# Patient Record
Sex: Male | Born: 1985
Health system: Southern US, Community
[De-identification: ages and names within clinical notes are randomized; demographics above are authoritative.]

---

## 2008-01-20 ENCOUNTER — Emergency Department (HOSPITAL_COMMUNITY): Admission: EM | Admit: 2008-01-20 | Discharge: 2008-01-20 | Payer: Self-pay | Admitting: Emergency Medicine

## 2010-11-13 ENCOUNTER — Encounter: Payer: Self-pay | Admitting: Gastroenterology

## 2011-07-17 LAB — POCT I-STAT, CHEM 8
BUN: 8
Calcium, Ion: 1.19
Chloride: 106
Creatinine, Ser: 1.2
Glucose, Bld: 97
HCT: 46
Hemoglobin: 15.6
Potassium: 4.9
Sodium: 141
TCO2: 27

## 2011-07-17 LAB — CBC
HCT: 42.8
Hemoglobin: 14.7
MCHC: 34.3
MCV: 81.3
Platelets: 216
RBC: 5.26
RDW: 13.4
WBC: 4.7

## 2011-07-17 LAB — URINALYSIS, ROUTINE W REFLEX MICROSCOPIC
Bilirubin Urine: NEGATIVE
Glucose, UA: NEGATIVE
Hgb urine dipstick: NEGATIVE
Ketones, ur: NEGATIVE
Nitrite: NEGATIVE
Protein, ur: NEGATIVE
Specific Gravity, Urine: 1.019
Urobilinogen, UA: 1
pH: 7.5

## 2011-07-17 LAB — DIFFERENTIAL
Basophils Absolute: 0
Lymphocytes Relative: 34
Neutro Abs: 2.7

## 2021-01-13 ENCOUNTER — Ambulatory Visit (INDEPENDENT_AMBULATORY_CARE_PROVIDER_SITE_OTHER): Payer: BC Managed Care – PPO | Admitting: Internal Medicine

## 2021-01-13 ENCOUNTER — Encounter: Payer: Self-pay | Admitting: Internal Medicine

## 2021-01-13 ENCOUNTER — Other Ambulatory Visit: Payer: Self-pay

## 2021-01-13 VITALS — BP 118/76 | HR 81 | Temp 98.1°F | Ht 70.0 in | Wt 221.0 lb

## 2021-01-13 DIAGNOSIS — Z6831 Body mass index (BMI) 31.0-31.9, adult: Secondary | ICD-10-CM

## 2021-01-13 DIAGNOSIS — E6609 Other obesity due to excess calories: Secondary | ICD-10-CM | POA: Diagnosis not present

## 2021-01-13 DIAGNOSIS — Z0000 Encounter for general adult medical examination without abnormal findings: Secondary | ICD-10-CM | POA: Diagnosis not present

## 2021-01-13 DIAGNOSIS — Z23 Encounter for immunization: Secondary | ICD-10-CM | POA: Diagnosis not present

## 2021-01-13 LAB — CBC WITH DIFFERENTIAL/PLATELET
Basophils Absolute: 0 10*3/uL (ref 0.0–0.1)
Basophils Relative: 0.6 % (ref 0.0–3.0)
Eosinophils Absolute: 0.3 10*3/uL (ref 0.0–0.7)
Eosinophils Relative: 4 % (ref 0.0–5.0)
HCT: 43.5 % (ref 39.0–52.0)
Hemoglobin: 14.4 g/dL (ref 13.0–17.0)
Lymphocytes Relative: 42.8 % (ref 12.0–46.0)
Lymphs Abs: 2.7 10*3/uL (ref 0.7–4.0)
MCHC: 33.1 g/dL (ref 30.0–36.0)
MCV: 82.4 fl (ref 78.0–100.0)
Monocytes Absolute: 0.4 10*3/uL (ref 0.1–1.0)
Monocytes Relative: 6.1 % (ref 3.0–12.0)
Neutro Abs: 3 10*3/uL (ref 1.4–7.7)
Neutrophils Relative %: 46.5 % (ref 43.0–77.0)
Platelets: 204 10*3/uL (ref 150.0–400.0)
RBC: 5.28 Mil/uL (ref 4.22–5.81)
RDW: 13.9 % (ref 11.5–15.5)
WBC: 6.4 10*3/uL (ref 4.0–10.5)

## 2021-01-13 LAB — LIPID PANEL
Cholesterol: 209 mg/dL — ABNORMAL HIGH (ref 0–200)
HDL: 33.5 mg/dL — ABNORMAL LOW (ref >39.00)
NonHDL: 175.8
Total CHOL/HDL Ratio: 6
Triglycerides: 276 mg/dL — ABNORMAL HIGH (ref 0.0–149.0)
VLDL: 55.2 mg/dL — ABNORMAL HIGH (ref 0.0–40.0)

## 2021-01-13 LAB — HEPATIC FUNCTION PANEL
ALT: 11 U/L (ref 0–53)
AST: 16 U/L (ref 0–37)
Albumin: 4.5 g/dL (ref 3.5–5.2)
Alkaline Phosphatase: 49 U/L (ref 39–117)
Bilirubin, Direct: 0.1 mg/dL (ref 0.0–0.3)
Total Bilirubin: 0.6 mg/dL (ref 0.2–1.2)
Total Protein: 7.6 g/dL (ref 6.0–8.3)

## 2021-01-13 LAB — BASIC METABOLIC PANEL
BUN: 13 mg/dL (ref 6–23)
CO2: 27 mEq/L (ref 19–32)
Calcium: 9.3 mg/dL (ref 8.4–10.5)
Chloride: 104 mEq/L (ref 96–112)
Creatinine, Ser: 1.12 mg/dL (ref 0.40–1.50)
GFR: 84.58 mL/min (ref >60.00)
Glucose, Bld: 103 mg/dL — ABNORMAL HIGH (ref 70–99)
Potassium: 4.2 mEq/L (ref 3.5–5.1)
Sodium: 140 mEq/L (ref 135–145)

## 2021-01-13 LAB — TSH: TSH: 1 u[IU]/mL (ref 0.35–4.50)

## 2021-01-13 LAB — LDL CHOLESTEROL, DIRECT: Direct LDL: 112 mg/dL

## 2021-01-13 LAB — HEMOGLOBIN A1C: Hgb A1c MFr Bld: 5.8 % (ref 4.6–6.5)

## 2021-01-13 NOTE — Progress Notes (Signed)
Subjective:  Patient ID: Gabriel Watts, male    DOB: 06/21/1986  Age: 35 y.o. MRN: [id number]  CC: Annual Exam  This visit occurred during the SARS-CoV-2 public health emergency.  Safety protocols were in place, including screening questions prior to the visit, additional usage of staff PPE, and extensive cleaning of exam room while observing appropriate contact time as indicated for disinfecting solutions.    HPI Gabriel Watts presents for a CPX and to establish.  He is with his wife today. He has felt well recently.  History Gabriel Watts has no past medical history on file.   He has no past surgical history on file.   His family history includes Arthritis in his mother; Cerebral palsy in his brother; Diabetes in his father, maternal grandmother, and mother.He reports that he has never smoked. He has never used smokeless tobacco. He reports current alcohol use of about 6.0 standard drinks of alcohol per week. He reports that he does not use drugs.  No outpatient medications prior to visit.   No facility-administered medications prior to visit.    ROS Review of Systems  Constitutional: Negative for diaphoresis, fatigue and unexpected weight change.  HENT: Negative.   Eyes: Negative.   Respiratory: Negative for chest tightness, shortness of breath and wheezing.   Cardiovascular: Negative for chest pain, palpitations and leg swelling.  Gastrointestinal: Negative for abdominal pain, constipation, diarrhea, nausea and vomiting.  Endocrine: Negative.   Genitourinary: Negative.  Negative for dysuria, scrotal swelling and testicular pain.  Musculoskeletal: Negative for arthralgias and back pain.  Skin: Negative.   Neurological: Negative.  Negative for dizziness, weakness, light-headedness, numbness and headaches.  Hematological: Negative for adenopathy. Does not bruise/bleed easily.  Psychiatric/Behavioral: Negative.     Objective:  BP 118/76    Pulse 81    Temp 98.1 F (36.7 C)  (Oral)    Ht 5\' 10"  (1.778 m)    Wt 221 lb (100.2 kg)    SpO2 98%    BMI 31.71 kg/m   Physical Exam Vitals reviewed.  Constitutional:      Appearance: He is obese.  HENT:     Nose: Nose normal.     Mouth/Throat:     Mouth: Mucous membranes are moist.  Eyes:     General: No scleral icterus.    Conjunctiva/sclera: Conjunctivae normal.  Cardiovascular:     Rate and Rhythm: Normal rate and regular rhythm.     Heart sounds: No murmur heard.   Pulmonary:     Effort: Pulmonary effort is normal.     Breath sounds: No stridor. No wheezing, rhonchi or rales.  Abdominal:     General: Abdomen is protuberant. Bowel sounds are normal. There is no distension.     Palpations: Abdomen is soft. There is no hepatomegaly, splenomegaly or mass.     Tenderness: There is no abdominal tenderness.  Musculoskeletal:        General: Normal range of motion.     Cervical back: Neck supple.     Right lower leg: No edema.     Left lower leg: No edema.  Lymphadenopathy:     Cervical: No cervical adenopathy.  Skin:    General: Skin is warm and dry.     Coloration: Skin is not pale.  Neurological:     General: No focal deficit present.     Mental Status: He is alert.  Psychiatric:        Mood and Affect: Mood normal.  Behavior: Behavior normal.     Lab Results  Component Value Date   WBC 6.4 01/13/2021   HGB 14.4 01/13/2021   HCT 43.5 01/13/2021   PLT 204.0 01/13/2021   GLUCOSE 103 (H) 01/13/2021   CHOL 209 (H) 01/13/2021   TRIG 276.0 (H) 01/13/2021   HDL 33.50 (L) 01/13/2021   LDLDIRECT 112.0 01/13/2021   ALT 11 01/13/2021   AST 16 01/13/2021   NA 140 01/13/2021   K 4.2 01/13/2021   CL 104 01/13/2021   CREATININE 1.12 01/13/2021   BUN 13 01/13/2021   CO2 27 01/13/2021   TSH 1.00 01/13/2021   HGBA1C 5.8 01/13/2021    Assessment & Plan:   Gabriel Watts was seen today for annual exam.  Diagnoses and all orders for this visit:  Routine general medical examination at a health care  facility- Exam completed, labs reviewed, he refused a flu vaccine, he was given a Tdap, patient education was given. -     Lipid panel; Future -     Hepatitis C antibody; Future -     HIV Antibody (routine testing w rflx); Future -     HIV Antibody (routine testing w rflx) -     Hepatitis C antibody -     Lipid panel  Class 1 obesity due to excess calories without serious comorbidity with body mass index (BMI) of 31.0 to 31.9 in adult- Labs are negative for secondary causes or complications. -     CBC with Differential/Platelet; Future -     Basic metabolic panel; Future -     TSH; Future -     Hepatic function panel; Future -     Hemoglobin A1c; Future -     Hemoglobin A1c -     Hepatic function panel -     TSH -     Basic metabolic panel -     CBC with Differential/Platelet  Other orders -     Tdap vaccine greater than or equal to 7yo IM -     LDL cholesterol, direct   Docia Chuck does not currently have medications on file.  No orders of the defined types were placed in this encounter.    Follow-up: Return if symptoms worsen or fail to improve.  Sanda Linger, MD

## 2021-01-13 NOTE — Patient Instructions (Addendum)

## 2021-01-14 ENCOUNTER — Encounter: Payer: Self-pay | Admitting: Internal Medicine

## 2021-01-14 LAB — HIV ANTIBODY (ROUTINE TESTING W REFLEX): HIV 1&2 Ab, 4th Generation: NONREACTIVE

## 2021-01-14 LAB — HEPATITIS C ANTIBODY
Hepatitis C Ab: NONREACTIVE
SIGNAL TO CUT-OFF: 0.01 (ref <1.00)

## 2021-06-13 ENCOUNTER — Other Ambulatory Visit: Payer: Self-pay

## 2021-06-13 ENCOUNTER — Emergency Department (HOSPITAL_COMMUNITY): Payer: BC Managed Care – PPO

## 2021-06-13 ENCOUNTER — Emergency Department (HOSPITAL_COMMUNITY)
Admission: EM | Admit: 2021-06-13 | Discharge: 2021-06-13 | Disposition: A | Discharge status: Home / Self Care | Payer: BC Managed Care – PPO | Attending: Emergency Medicine | Admitting: Emergency Medicine

## 2021-06-13 DIAGNOSIS — E860 Dehydration: Secondary | ICD-10-CM | POA: Diagnosis not present

## 2021-06-13 DIAGNOSIS — R1032 Left lower quadrant pain: Secondary | ICD-10-CM | POA: Diagnosis not present

## 2021-06-13 DIAGNOSIS — R1084 Generalized abdominal pain: Secondary | ICD-10-CM | POA: Diagnosis not present

## 2021-06-13 DIAGNOSIS — N201 Calculus of ureter: Secondary | ICD-10-CM | POA: Diagnosis not present

## 2021-06-13 DIAGNOSIS — K2970 Gastritis, unspecified, without bleeding: Secondary | ICD-10-CM | POA: Diagnosis not present

## 2021-06-13 DIAGNOSIS — K760 Fatty (change of) liver, not elsewhere classified: Secondary | ICD-10-CM | POA: Diagnosis not present

## 2021-06-13 DIAGNOSIS — N132 Hydronephrosis with renal and ureteral calculous obstruction: Secondary | ICD-10-CM | POA: Diagnosis not present

## 2021-06-13 DIAGNOSIS — N200 Calculus of kidney: Secondary | ICD-10-CM | POA: Diagnosis not present

## 2021-06-13 DIAGNOSIS — R1111 Vomiting without nausea: Secondary | ICD-10-CM | POA: Diagnosis not present

## 2021-06-13 LAB — CBC WITH DIFFERENTIAL/PLATELET
Abs Immature Granulocytes: 0.07 10*3/uL (ref 0.00–0.07)
Basophils Absolute: 0 10*3/uL (ref 0.0–0.1)
Basophils Relative: 0 %
Eosinophils Absolute: 0.2 10*3/uL (ref 0.0–0.5)
Eosinophils Relative: 2 %
HCT: 41.4 % (ref 39.0–52.0)
Hemoglobin: 14 g/dL (ref 13.0–17.0)
Immature Granulocytes: 1 %
Lymphocytes Relative: 23 %
Lymphs Abs: 2.2 10*3/uL (ref 0.7–4.0)
MCH: 27.6 pg (ref 26.0–34.0)
MCHC: 33.8 g/dL (ref 30.0–36.0)
MCV: 81.7 fL (ref 80.0–100.0)
Monocytes Absolute: 0.5 10*3/uL (ref 0.1–1.0)
Monocytes Relative: 5 %
Neutro Abs: 6.7 10*3/uL (ref 1.7–7.7)
Neutrophils Relative %: 69 %
Platelets: 216 10*3/uL (ref 150–400)
RBC: 5.07 MIL/uL (ref 4.22–5.81)
RDW: 14.2 % (ref 11.5–15.5)
WBC: 9.7 10*3/uL (ref 4.0–10.5)
nRBC: 0 % (ref 0.0–0.2)

## 2021-06-13 LAB — COMPREHENSIVE METABOLIC PANEL
ALT: 23 U/L (ref 0–44)
AST: 23 U/L (ref 15–41)
Albumin: 4.3 g/dL (ref 3.5–5.0)
Alkaline Phosphatase: 48 U/L (ref 38–126)
Anion gap: 11 (ref 5–15)
BUN: 16 mg/dL (ref 6–20)
CO2: 24 mmol/L (ref 22–32)
Calcium: 8.7 mg/dL — ABNORMAL LOW (ref 8.9–10.3)
Chloride: 104 mmol/L (ref 98–111)
Creatinine, Ser: 1.35 mg/dL — ABNORMAL HIGH (ref 0.61–1.24)
GFR, Estimated: >60 mL/min (ref >60)
Glucose, Bld: 144 mg/dL — ABNORMAL HIGH (ref 70–99)
Potassium: 3.4 mmol/L — ABNORMAL LOW (ref 3.5–5.1)
Sodium: 139 mmol/L (ref 135–145)
Total Bilirubin: 0.8 mg/dL (ref 0.3–1.2)
Total Protein: 7.7 g/dL (ref 6.5–8.1)

## 2021-06-13 LAB — URINALYSIS, ROUTINE W REFLEX MICROSCOPIC
Bilirubin Urine: NEGATIVE
Glucose, UA: NEGATIVE mg/dL
Hgb urine dipstick: NEGATIVE
Ketones, ur: 5 mg/dL — AB
Leukocytes,Ua: NEGATIVE
Nitrite: NEGATIVE
Protein, ur: NEGATIVE mg/dL
Specific Gravity, Urine: 1.026 (ref 1.005–1.030)
pH: 6 (ref 5.0–8.0)

## 2021-06-13 MED ORDER — ONDANSETRON HCL 4 MG PO TABS
4.0000 mg | ORAL_TABLET | Freq: Three times a day (TID) | ORAL | 0 refills | Status: DC | PRN
Start: 2021-06-13 — End: 2024-05-15

## 2021-06-13 MED ORDER — MORPHINE SULFATE (PF) 4 MG/ML IV SOLN
4.0000 mg | Freq: Once | INTRAVENOUS | Status: AC
  Administered 2021-06-13: 4 mg via INTRAVENOUS
  Filled 2021-06-13: qty 1

## 2021-06-13 MED ORDER — KETOROLAC TROMETHAMINE 15 MG/ML IJ SOLN
15.0000 mg | Freq: Once | INTRAMUSCULAR | Status: AC
  Administered 2021-06-13: 15 mg via INTRAVENOUS
  Filled 2021-06-13: qty 1

## 2021-06-13 MED ORDER — HYDROCODONE-ACETAMINOPHEN 5-325 MG PO TABS: 1.0000 | ORAL_TABLET | ORAL | 0 refills | Status: DC | PRN

## 2021-06-13 MED ORDER — IBUPROFEN 600 MG PO TABS: 600.0000 mg | ORAL_TABLET | Freq: Four times a day (QID) | ORAL | 0 refills | Status: DC | PRN

## 2021-06-13 MED ORDER — TAMSULOSIN HCL 0.4 MG PO CAPS: 0.4000 mg | ORAL_CAPSULE | Freq: Every day | ORAL | 0 refills | Status: AC

## 2021-06-13 MED ORDER — ONDANSETRON HCL 4 MG/2ML IJ SOLN
4.0000 mg | Freq: Once | INTRAMUSCULAR | Status: AC
  Administered 2021-06-13: 4 mg via INTRAVENOUS
  Filled 2021-06-13: qty 2

## 2021-06-13 MED ORDER — SODIUM CHLORIDE 0.9 % IV BOLUS
500.0000 mL | Freq: Once | INTRAVENOUS | Status: AC
  Administered 2021-06-13: 500 mL via INTRAVENOUS

## 2021-06-13 NOTE — ED Notes (Signed)
Patient currently in CT °

## 2021-06-13 NOTE — ED Provider Notes (Signed)
[facility]-EMERGENCY DEPT Provider Note   CSN: [id number] Arrival date & time: 06/13/21  0601     History Chief Complaint  Patient presents with   Abdominal Pain    Gabriel Watts is a 35 y.o. male.  The history is provided by the patient.  Abdominal Pain Gabriel Watts is a 35 y.o. male who presents to the Emergency Department complaining of abdominal pain. He presents the emergency department complaining of severe left lower quadrant abdominal pain that started about four hours prior to ED arrival. Pain is constant nature. He has associated significant nausea and vomiting. He also has a sensation of pressure when he attempts to urinate. He did eat [place] food earlier and wonders if he could've gotten food poisoning. No associated fevers, diarrhea, dysuria. He has no known medical problems and takes no medications. No prior similar symptoms. No history of kidney stones.    No past medical history on file.  Patient Active Problem List   Diagnosis Date Noted   Routine general medical examination at a health care facility 01/13/2021   Class 1 obesity due to excess calories without serious comorbidity with body mass index (BMI) of 31.0 to 31.9 in adult 01/13/2021    No past surgical history on file.     Family History  Problem Relation Age of Onset   Diabetes Mother    Arthritis Mother    Diabetes Father    Cerebral palsy Brother    Diabetes Maternal Grandmother     Social History   Tobacco Use   Smoking status: Never   Smokeless tobacco: Never  Substance Use Topics   Alcohol use: Yes    Alcohol/week: 6.0 standard drinks    Types: 6 Shots of liquor per week   Drug use: Never    Home Medications Prior to Admission medications   Not on File    Allergies    Patient has no known allergies.  Review of Systems   Review of Systems  Gastrointestinal:  Positive for abdominal pain.  All other systems reviewed and are negative.  Physical  Exam Updated Vital Signs BP 122/73 (BP Location: Left Arm)   Pulse 72   Temp 98.1 F (36.7 C) (Oral)   Resp 18   Ht 5\' 10"  (1.778 m)   Wt 102.1 kg   SpO2 100%   BMI 32.28 kg/m   Physical Exam Vitals and nursing note reviewed.  Constitutional:      General: He is in acute distress.     Appearance: He is well-developed.     Comments: Uncomfortable appearing, writhing on the stretcher  HENT:     Head: Normocephalic and atraumatic.  Cardiovascular:     Rate and Rhythm: Normal rate and regular rhythm.     Heart sounds: No murmur heard. Pulmonary:     Effort: Pulmonary effort is normal. No respiratory distress.     Breath sounds: Normal breath sounds.  Abdominal:     Palpations: Abdomen is soft.     Tenderness: There is no abdominal tenderness. There is no guarding or rebound.  Musculoskeletal:        General: No swelling or tenderness.  Skin:    General: Skin is warm and dry.  Neurological:     Mental Status: He is alert and oriented to person, place, and time.  Psychiatric:        Behavior: Behavior normal.    ED Results / Procedures / Treatments   Labs (all labs ordered are  listed, but only abnormal results are displayed) Labs Reviewed  URINALYSIS, ROUTINE W REFLEX MICROSCOPIC - Abnormal; Notable for the following components:      Result Value   Ketones, ur 5 (*)    All other components within normal limits  CBC WITH DIFFERENTIAL/PLATELET  COMPREHENSIVE METABOLIC PANEL    EKG None  Radiology No results found.  Procedures Procedures   Medications Ordered in ED Medications  ketorolac (TORADOL) 15 MG/ML injection 15 mg (15 mg Intravenous Given 06/13/21 0641)  morphine 4 MG/ML injection 4 mg (4 mg Intravenous Given 06/13/21 0638)  ondansetron (ZOFRAN) injection 4 mg (4 mg Intravenous Given 06/13/21 0637)  sodium chloride 0.9 % bolus 500 mL (500 mLs Intravenous New Bag/Given 06/13/21 0636)    ED Course  I have reviewed the triage vital signs and the nursing  notes.  Pertinent labs & imaging results that were available during my care of the patient were reviewed by me and considered in my medical decision making (see chart for details).    MDM Rules/Calculators/A&P                          patient here for evaluation of left lower quadrant pain. He is uncomfortable appearing on evaluation, concern for kidney stone. Treating his pain. Patient care transferred pending labs, imaging and reassessment.  Final Clinical Impression(s) / ED Diagnoses Final diagnoses:  None    Rx / DC Orders ED Discharge Orders     None        Tilden Fossa, MD 06/13/21 [phone]

## 2021-06-13 NOTE — ED Provider Notes (Signed)
  Physical Exam  BP 122/73 (BP Location: Left Arm)   Pulse 72   Temp 98.1 F (36.7 C) (Oral)   Resp 18   Ht 5\' 10"  (1.778 m)   Wt 102.1 kg   SpO2 100%   BMI 32.28 kg/m   Physical Exam Vitals and nursing note reviewed.  Constitutional:      General: He is not in acute distress.    Appearance: He is well-developed.  HENT:     Head: Normocephalic and atraumatic.     Right Ear: External ear normal.     Left Ear: External ear normal.     Mouth/Throat:     Mouth: Mucous membranes are moist.  Eyes:     General: No scleral icterus. Cardiovascular:     Rate and Rhythm: Normal rate and regular rhythm.     Pulses: Normal pulses.     Heart sounds: Normal heart sounds.  Pulmonary:     Effort: Pulmonary effort is normal. No respiratory distress.     Breath sounds: Normal breath sounds.  Abdominal:     General: Abdomen is flat.     Palpations: Abdomen is soft.     Tenderness: There is no abdominal tenderness.  Musculoskeletal:        General: Normal range of motion.     Cervical back: Normal range of motion.     Right lower leg: No edema.     Left lower leg: No edema.  Skin:    General: Skin is warm and dry.     Capillary Refill: Capillary refill takes less than 2 seconds.  Neurological:     Mental Status: He is alert and oriented to person, place, and time.  Psychiatric:        Mood and Affect: Mood normal.        Behavior: Behavior normal.    ED Course/Procedures     Procedures  MDM   35 year old male with history as above presenting to the ER secondary to abdominal pain.  Patient received at handoff, see previous physician for full ED note.  Upon my assessment patient reports his pain has improved significantly, he is able tolerate p.o.  No nausea or vomiting.  Abdomen is soft, non-peritoneal.   Labs reviewed, creatinine mildly elevated consistent with mild dehydration.  Ketones in urine.  Mild dehydration.  IV fluids.  He is tolerating p.o.   CT with 92mm stone.  Given size this will likely pass spontaneously. D/w patient and family and they would like to trial outpatient therapy. Able to contract for safety.   Pt advised to strain all urine  F/u with urology  D/c with analgesics, flomax, anti-emetics, rehydration guidance  The patient improved significantly and was discharged in stable condition. Detailed discussions were had with the patient regarding current findings, and need for close f/u with PCP or on call doctor. The patient has been instructed to return immediately if the symptoms worsen in any way for re-evaluation. Patient verbalized understanding and is in agreement with current care plan. All questions answered prior to discharge.          1m, DO 06/13/21 [phone]

## 2021-06-13 NOTE — ED Notes (Signed)
Strainer given  

## 2021-06-13 NOTE — ED Triage Notes (Signed)
Patient coming to ED for evaluation of abdominal pain.  Reports pain woke him about 3 hours ago from sleep.  No c/o diarrhea.  Has had nausea and vomiting.  Reports "I think I might have food poisoning."

## 2022-03-27 IMAGING — CT CT RENAL STONE PROTOCOL
2 of 4 series · 15 of 46 positions shown, 17 images · non-contrast
Comparison: No priors.

CLINICAL DATA: 34-year-old male with history of flank pain.
Suspected kidney stone.

EXAM:
CT ABDOMEN AND PELVIS WITHOUT CONTRAST
TECHNIQUE: Multidetector CT imaging of the abdomen and pelvis was performed
following the standard protocol without IV contrast.

[Series 2: axial st · axial · 0.83mm/px · z∈[-531,-81]mm · 12 of 102 slices shown, 14 images]
[im 6/102  soft-tissue]
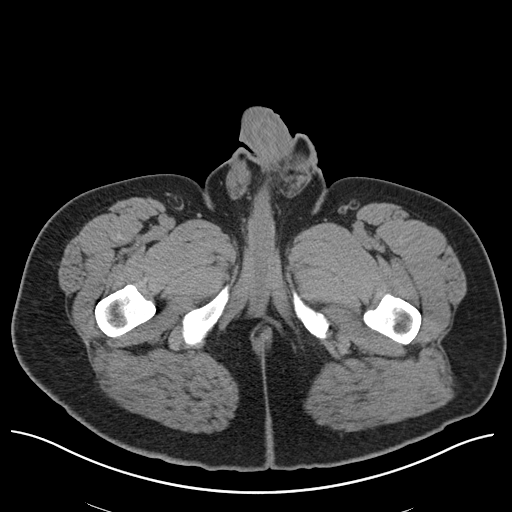
[im 6/102  bone]
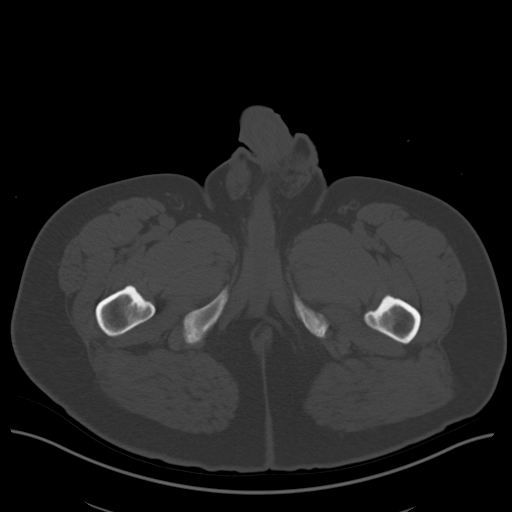
[im 16/102  soft-tissue]
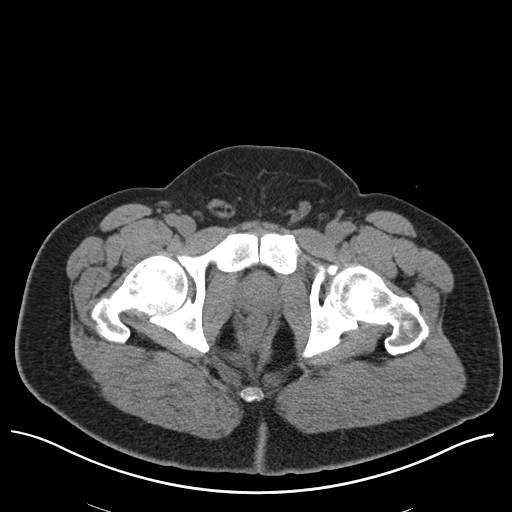
[im 22/102  soft-tissue]
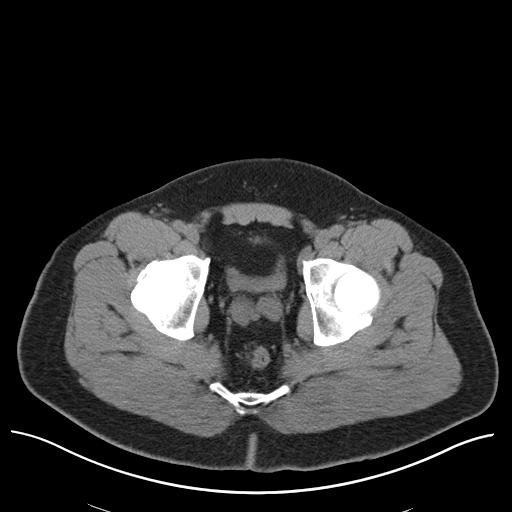
[im 32/102  soft-tissue]
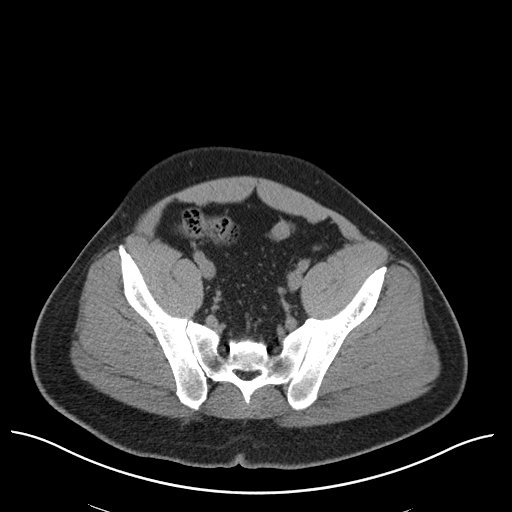
[im 38/102  soft-tissue]
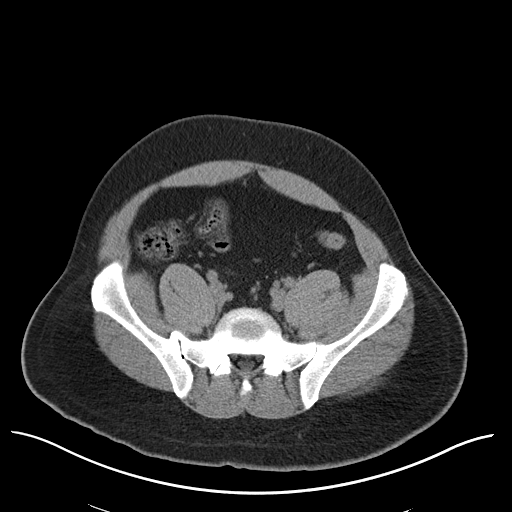
[im 48/102  soft-tissue]
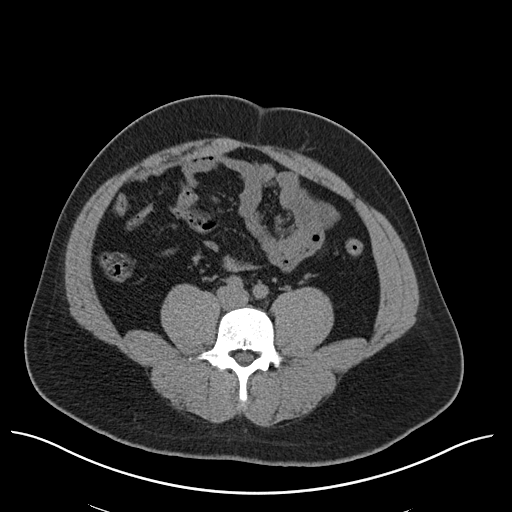
[im 54/102  soft-tissue]
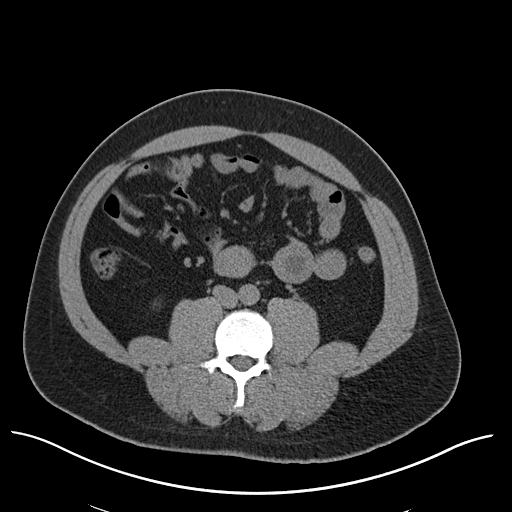
[im 64/102  soft-tissue]
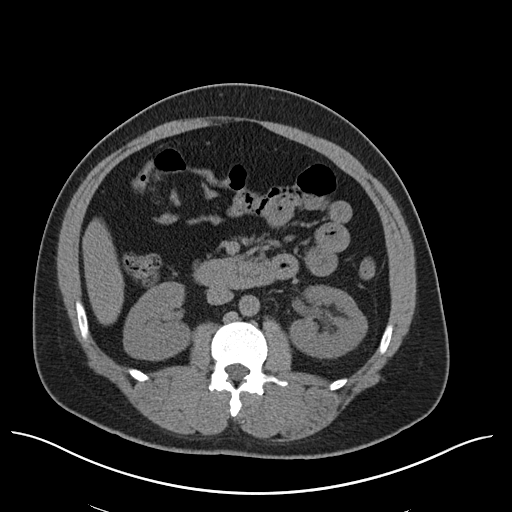
[im 70/102  soft-tissue]
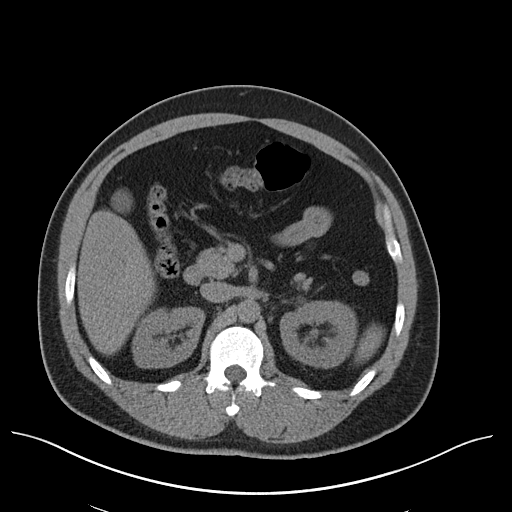
[im 70/102  bone]
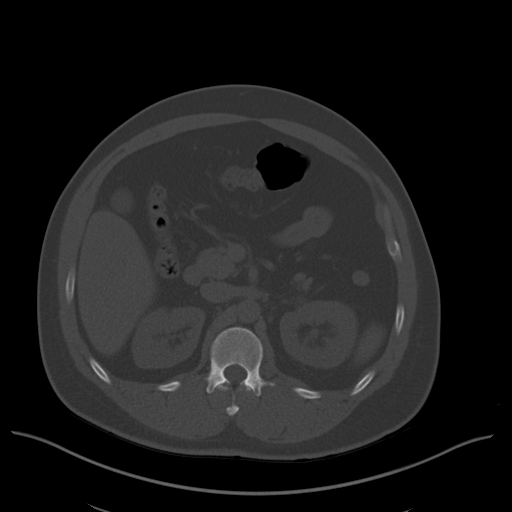
[im 80/102  soft-tissue]
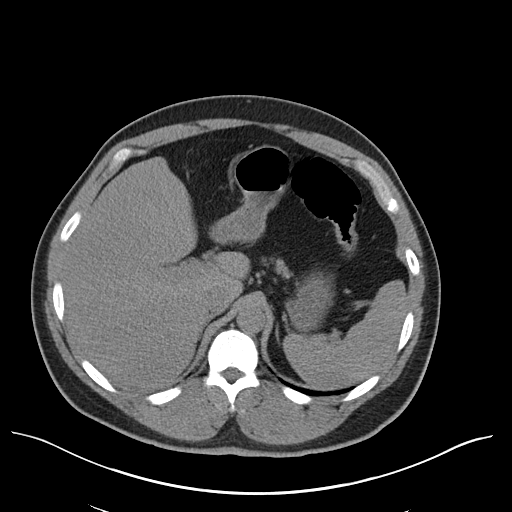
[im 86/102  soft-tissue]
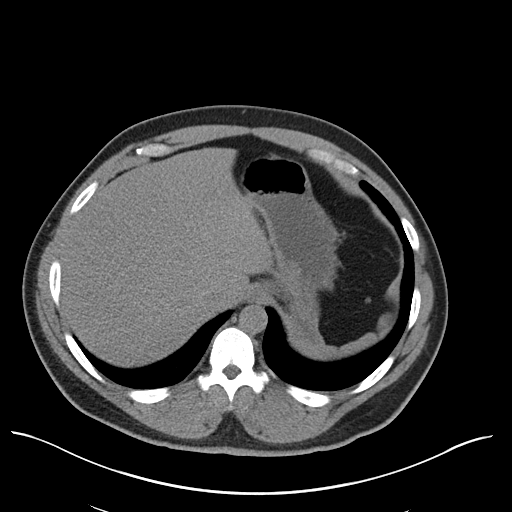
[im 96/102  soft-tissue]
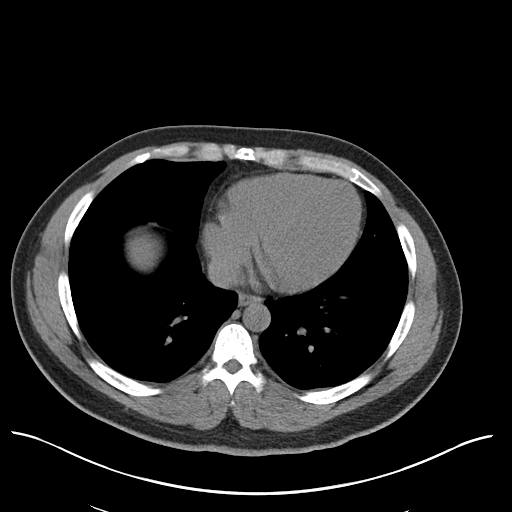

[Series 5: coronal · coronal · 0.77mm/px · 3 of 168 slices shown]
[im 56/168  soft-tissue]
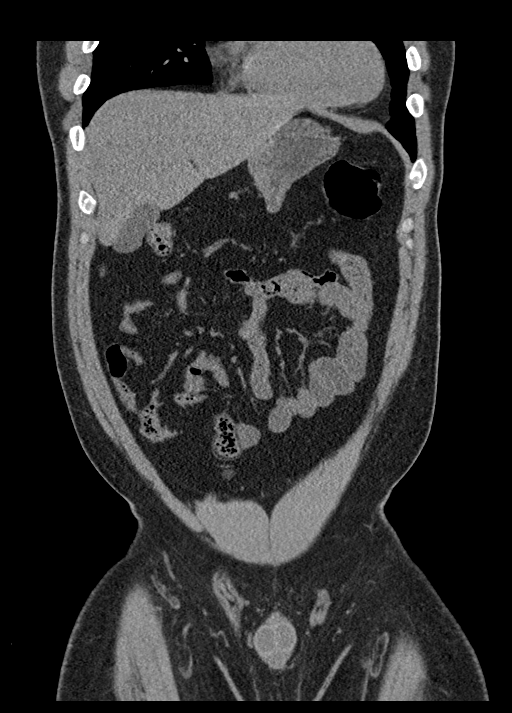
[im 75/168  soft-tissue]
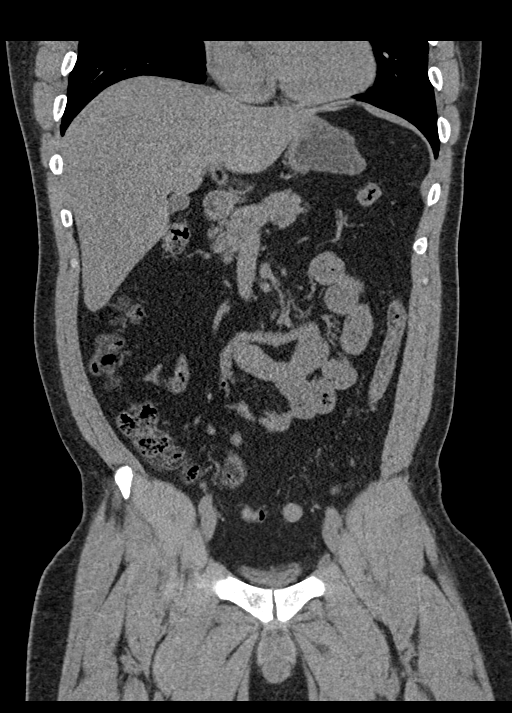
[im 93/168  soft-tissue]
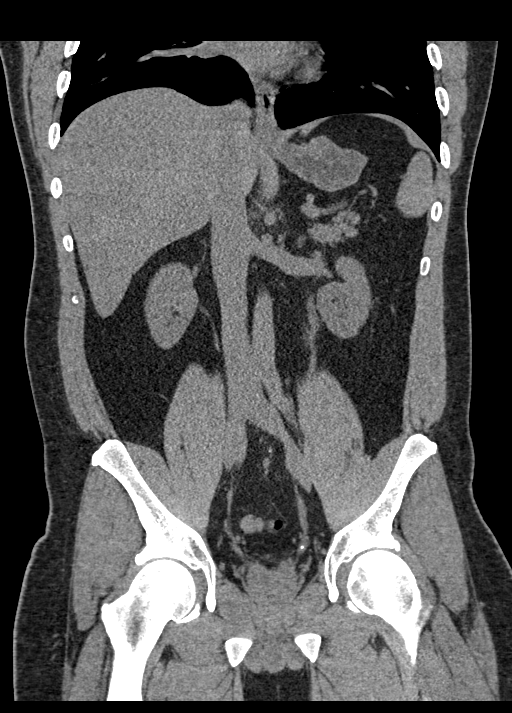

[15 of 46 positions shown; findings below may reference images not displayed]

FINDINGS: Lower chest: Unremarkable.

Hepatobiliary: Mild diffuse low attenuation throughout the hepatic
parenchyma, indicative of a background of hepatic steatosis. No
discrete cystic or solid hepatic lesions are confidently identified
on today's noncontrast CT examination. Unenhanced appearance of the
gallbladder is normal.

Pancreas: No definite pancreatic mass or peripancreatic fluid
collections or inflammatory changes are noted on today's noncontrast
CT examination.

Spleen: Unremarkable.

Adrenals/Urinary Tract: In the distal third of the left ureter
shortly before the left ureterovesicular junction (axial image 79 of
series 2) there is a 3 mm calculus. This is associated with very
mild proximal left hydroureteronephrosis. No additional calculi are
noted within the collecting system of either kidney, along the
course of the right ureter or within the lumen of the urinary
bladder. No right hydroureteronephrosis. Unenhanced appearance of
the kidneys, bilateral adrenal glands and urinary bladder is
otherwise unremarkable.

Stomach/Bowel: Unenhanced appearance of the stomach is normal. No
pathologic dilatation of small bowel or colon. Normal appendix.

Vascular/Lymphatic: Atherosclerotic calcifications in the pelvic
vasculature. No lymphadenopathy noted in the abdomen or pelvis.

Reproductive: Prostate gland and seminal vesicles are unremarkable
in appearance.

Other: No significant volume of ascites.  No pneumoperitoneum.

Musculoskeletal: There are no aggressive appearing lytic or blastic
lesions noted in the visualized portions of the skeleton.
IMPRESSION: 1. 3 mm calculus in the distal third of the left ureter shortly
before the left ureterovesicular junction with very mild proximal
left hydroureteronephrosis indicating obstruction at this time.
2. Mild hepatic steatosis.

## 2023-05-03 ENCOUNTER — Ambulatory Visit: Payer: 59 | Admitting: Internal Medicine

## 2023-05-03 ENCOUNTER — Encounter: Payer: Self-pay | Admitting: Internal Medicine

## 2023-05-03 VITALS — BP 118/84 | HR 92 | Temp 98.3°F | Ht 70.0 in | Wt 232.6 lb

## 2023-05-03 DIAGNOSIS — Z0000 Encounter for general adult medical examination without abnormal findings: Secondary | ICD-10-CM

## 2023-05-03 DIAGNOSIS — E6609 Other obesity due to excess calories: Secondary | ICD-10-CM | POA: Diagnosis not present

## 2023-05-03 DIAGNOSIS — H6121 Impacted cerumen, right ear: Secondary | ICD-10-CM | POA: Diagnosis not present

## 2023-05-03 DIAGNOSIS — Z7689 Persons encountering health services in other specified circumstances: Secondary | ICD-10-CM

## 2023-05-03 DIAGNOSIS — Z6833 Body mass index (BMI) 33.0-33.9, adult: Secondary | ICD-10-CM

## 2023-05-03 NOTE — Patient Instructions (Signed)
Health Maintenance, Male Adopting a healthy lifestyle and getting preventive care are important in promoting health and wellness. Ask your health care provider about: The right schedule for you to have regular tests and exams. Things you can do on your own to prevent diseases and keep yourself healthy. What should I know about diet, weight, and exercise? Eat a healthy diet  Eat a diet that includes plenty of vegetables, fruits, low-fat dairy products, and lean protein. Do not eat a lot of foods that are high in solid fats, added sugars, or sodium. Maintain a healthy weight Body mass index (BMI) is a measurement that can be used to identify possible weight problems. It estimates body fat based on height and weight. Your health care provider can help determine your BMI and help you achieve or maintain a healthy weight. Get regular exercise Get regular exercise. This is one of the most important things you can do for your health. Most adults should: Exercise for at least 150 minutes each week. The exercise should increase your heart rate and make you sweat (moderate-intensity exercise). Do strengthening exercises at least twice a week. This is in addition to the moderate-intensity exercise. Spend less time sitting. Even light physical activity can be beneficial. Watch cholesterol and blood lipids Have your blood tested for lipids and cholesterol at 39 years of age, then have this test every 5 years. You may need to have your cholesterol levels checked more often if: Your lipid or cholesterol levels are high. You are older than 40 years of age. You are at high risk for heart disease. What should I know about cancer screening? Many types of cancers can be detected early and may often be prevented. Depending on your health history and family history, you may need to have cancer screening at various ages. This may include screening for: Colorectal cancer. Prostate cancer. Skin cancer. Lung  cancer. What should I know about heart disease, diabetes, and high blood pressure? Blood pressure and heart disease High blood pressure causes heart disease and increases the risk of stroke. This is more likely to develop in people who have high blood pressure readings or are overweight. Talk with your health care provider about your target blood pressure readings. Have your blood pressure checked: Every 3-5 years if you are 18-39 years of age. Every year if you are 40 years old or older. If you are between the ages of 65 and 75 and are a current or former smoker, ask your health care provider if you should have a one-time screening for abdominal aortic aneurysm (AAA). Diabetes Have regular diabetes screenings. This checks your fasting blood sugar level. Have the screening done: Once every three years after age 45 if you are at a normal weight and have a low risk for diabetes. More often and at a younger age if you are overweight or have a high risk for diabetes. What should I know about preventing infection? Hepatitis B If you have a higher risk for hepatitis B, you should be screened for this virus. Talk with your health care provider to find out if you are at risk for hepatitis B infection. Hepatitis C Blood testing is recommended for: Everyone born from 1945 through 1965. Anyone with known risk factors for hepatitis C. Sexually transmitted infections (STIs) You should be screened each year for STIs, including gonorrhea and chlamydia, if: You are sexually active and are younger than 39 years of age. You are older than 39 years of age and your   health care provider tells you that you are at risk for this type of infection. Your sexual activity has changed since you were last screened, and you are at increased risk for chlamydia or gonorrhea. Ask your health care provider if you are at risk. Ask your health care provider about whether you are at high risk for HIV. Your health care provider  may recommend a prescription medicine to help prevent HIV infection. If you choose to take medicine to prevent HIV, you should first get tested for HIV. You should then be tested every 3 months for as long as you are taking the medicine. Follow these instructions at home: Alcohol use Do not drink alcohol if your health care provider tells you not to drink. If you drink alcohol: Limit how much you have to 0-2 drinks a day. Know how much alcohol is in your drink. In the [place] one drink equals one 12 oz bottle of beer (355 mL), one 5 oz glass of wine (148 mL), or one 1 oz glass of hard liquor (44 mL). Lifestyle Do not use any products that contain nicotine or tobacco. These products include cigarettes, chewing tobacco, and vaping devices, such as e-cigarettes. If you need help quitting, ask your health care provider. Do not use street drugs. Do not share needles. Ask your health care provider for help if you need support or information about quitting drugs. General instructions Schedule regular health, dental, and eye exams. Stay current with your vaccines. Tell your health care provider if: You often feel depressed. You have ever been abused or do not feel safe at home. Summary Adopting a healthy lifestyle and getting preventive care are important in promoting health and wellness. Follow your health care provider's instructions about healthy diet, exercising, and getting tested or screened for diseases. Follow your health care provider's instructions on monitoring your cholesterol and blood pressure. This information is not intended to replace advice given to you by your health care provider. Make sure you discuss any questions you have with your health care provider. Document Revised: 02/28/2021 Document Reviewed: 02/28/2021 Elsevier Patient Education  2024 [employer]  

## 2023-05-03 NOTE — Progress Notes (Signed)
I,Victoria T Deloria Lair, CMA,acting as a Neurosurgeon for Gwynneth Aliment, MD.,have documented all relevant documentation on the behalf of Gwynneth Aliment, MD,as directed by  Gwynneth Aliment, MD while in the presence of Gwynneth Aliment, MD.  Subjective:   Patient ID: Gabriel Watts , male    DOB: Jul 06, 1986 , 37 y.o.   MRN: [id number]  No chief complaint on file.   HPI  Patient presents today to establish care. Previous pcp: Sanda Linger. He denies having history of high blood pressure, diabetes and anemia.  He states he found this practice online. He denies having any history of chronic medical illnesses.  He denies having any specific questions or concerns.  He works Radio producer, he is a Environmental consultant. He is from [place], [place].  He has 2 children, son and a daughter. He admits he is not exercising as much has he had in the past.       History reviewed. No pertinent past medical history.   Family History  Problem Relation Age of Onset   Diabetes Mother    Arthritis Mother    Diabetes Father    Cerebral palsy Brother    Diabetes Maternal Grandmother      Current Outpatient Medications:    HYDROcodone-acetaminophen (NORCO/VICODIN) 5-325 MG tablet, Take 1 tablet by mouth every 4 (four) hours as needed. (Patient not taking: Reported on 05/03/2023), Disp: 10 tablet, Rfl: 0   ibuprofen (ADVIL) 600 MG tablet, Take 1 tablet (600 mg total) by mouth every 6 (six) hours as needed. (Patient not taking: Reported on 05/03/2023), Disp: 30 tablet, Rfl: 0   ondansetron (ZOFRAN) 4 MG tablet, Take 1 tablet (4 mg total) by mouth every 8 (eight) hours as needed for nausea or vomiting. (Patient not taking: Reported on 05/03/2023), Disp: 10 tablet, Rfl: 0   No Known Allergies   Men's preventive visit. Patient Health Questionnaire (PHQ-2) is  Flowsheet Row Office Visit from 05/03/2023 in [facility]  PHQ-2 Total Score 0     . Patient is on a liberal/regular diet. Marital  status: Married. Relevant history for alcohol use is:  Social History   Substance and Sexual Activity  Alcohol Use Yes   Comment: socially  . Relevant history for tobacco use is:  Social History   Tobacco Use  Smoking Status Never  Smokeless Tobacco Never  .   Review of Systems  Constitutional: Negative.   HENT: Negative.    Eyes: Negative.   Respiratory: Negative.    Cardiovascular: Negative.   Gastrointestinal: Negative.   Endocrine: Negative.   Genitourinary: Negative.   Musculoskeletal: Negative.   Skin: Negative.   Allergic/Immunologic: Negative.   Neurological: Negative.   Hematological: Negative.   Psychiatric/Behavioral: Negative.       Today's Vitals   05/03/23 1533  BP: 118/84  Pulse: 92  Temp: 98.3 F (36.8 C)  SpO2: 98%  Weight: 232 lb 9.6 oz (105.5 kg)  Height: 5\' 10"  (1.778 m)   Body mass index is 33.37 kg/m.  Wt Readings from Last 3 Encounters:  05/03/23 232 lb 9.6 oz (105.5 kg)  06/13/21 225 lb (102.1 kg)  01/13/21 221 lb (100.2 kg)    Objective:  Physical Exam Vitals and nursing note reviewed.  Constitutional:      Appearance: Normal appearance.  HENT:     Head: Normocephalic and atraumatic.     Right Ear: Ear canal and external ear normal. There is impacted cerumen.     Left  Ear: Tympanic membrane, ear canal and external ear normal.     Nose: Nose normal.     Mouth/Throat:     Mouth: Mucous membranes are moist.     Pharynx: Oropharynx is clear.  Eyes:     Extraocular Movements: Extraocular movements intact.     Conjunctiva/sclera: Conjunctivae normal.     Pupils: Pupils are equal, round, and reactive to light.  Cardiovascular:     Rate and Rhythm: Normal rate and regular rhythm.     Pulses: Normal pulses.     Heart sounds: Normal heart sounds.  Pulmonary:     Effort: Pulmonary effort is normal.     Breath sounds: Normal breath sounds.  Abdominal:     General: Bowel sounds are normal.     Palpations: Abdomen is soft.   Genitourinary:    Comments: deferred Musculoskeletal:        General: Normal range of motion.     Cervical back: Normal range of motion and neck supple.  Skin:    General: Skin is warm and dry.  Neurological:     General: No focal deficit present.     Mental Status: He is alert and oriented to person, place, and time.  Psychiatric:        Mood and Affect: Mood normal.        Behavior: Behavior normal.         Assessment And Plan:    Routine general medical examination at health care facility Assessment & Plan: A full exam was performed.  DRE deferred. PATIENT IS ADVISED TO GET 30-45 MINUTES REGULAR EXERCISE NO LESS THAN FOUR TO FIVE DAYS PER WEEK - BOTH WEIGHTBEARING EXERCISES AND AEROBIC ARE RECOMMENDED.  PATIENT IS ADVISED TO FOLLOW A HEALTHY DIET WITH AT LEAST SIX FRUITS/VEGGIES PER DAY, DECREASE INTAKE OF RED MEAT, AND TO INCREASE FISH INTAKE TO TWO DAYS PER WEEK.  MEATS/FISH SHOULD NOT BE FRIED, BAKED OR BROILED IS PREFERABLE.  IT IS ALSO IMPORTANT TO CUT BACK ON YOUR SUGAR INTAKE. PLEASE AVOID ANYTHING WITH ADDED SUGAR, CORN SYRUP OR OTHER SWEETENERS. IF YOU MUST USE A SWEETENER, YOU CAN TRY STEVIA. IT IS ALSO IMPORTANT TO AVOID ARTIFICIALLY SWEETENERS AND DIET BEVERAGES. LASTLY, I SUGGEST WEARING SPF 50 SUNSCREEN ON EXPOSED PARTS AND ESPECIALLY WHEN IN THE DIRECT SUNLIGHT FOR AN EXTENDED PERIOD OF TIME.  PLEASE AVOID FAST FOOD RESTAURANTS AND INCREASE YOUR WATER INTAKE.   Orders: -     CMP14+EGFR -     Lipid panel -     Hemoglobin A1c -     CBC -     TSH  Right ear impacted cerumen Assessment & Plan: AFTER OBTAINING VERBAL CONSENT, RIGHT EAR WAS FLUSHED BY IRRIGATION. HE TOLERATED PROCEDURE WELL WITHOUT ANY COMPLICATIONS. NO TM ABNORMALITIES WERE NOTED.   Orders: -     Ear Lavage  Class 1 obesity due to excess calories without serious comorbidity with body mass index (BMI) of 33.0 to 33.9 in adult Assessment & Plan: He is encouraged to initially strive for BMI less  than 30 to decrease cardiac risk. He is advised to exercise no less than 150 minutes per week.     Encounter to establish care with new doctor  He is encouraged to strive for BMI less than 30 to decrease cardiac risk. Advised to aim for at least 150 minutes of exercise per week.    Return for 1 year HM .  Patient was given opportunity to ask questions. Patient verbalized understanding of  the plan and was able to repeat key elements of the plan. All questions were answered to their satisfaction.   I, Gwynneth Aliment, MD, have reviewed all documentation for this visit. The documentation on 05/03/23 for the exam, diagnosis, procedures, and orders are all accurate and complete.

## 2023-05-04 LAB — CBC
Hematocrit: 45.2 % (ref 37.5–51.0)
Hemoglobin: 15.2 g/dL (ref 13.0–17.7)
MCH: 27.3 pg (ref 26.6–33.0)
MCHC: 33.6 g/dL (ref 31.5–35.7)
MCV: 81 fL (ref 79–97)
Platelets: 227 10*3/uL (ref 150–450)
RBC: 5.57 x10E6/uL (ref 4.14–5.80)
RDW: 13.9 % (ref 11.6–15.4)
WBC: 5.4 10*3/uL (ref 3.4–10.8)

## 2023-05-04 LAB — CMP14+EGFR
ALT: 31 IU/L (ref 0–44)
AST: 18 IU/L (ref 0–40)
Albumin: 4.7 g/dL (ref 4.1–5.1)
Alkaline Phosphatase: 69 IU/L (ref 44–121)
BUN/Creatinine Ratio: 8 — ABNORMAL LOW (ref 9–20)
BUN: 9 mg/dL (ref 6–20)
Bilirubin Total: 0.6 mg/dL (ref 0.0–1.2)
CO2: 24 mmol/L (ref 20–29)
Calcium: 9.7 mg/dL (ref 8.7–10.2)
Chloride: 105 mmol/L (ref 96–106)
Creatinine, Ser: 1.13 mg/dL (ref 0.76–1.27)
Globulin, Total: 3.1 g/dL (ref 1.5–4.5)
Glucose: 87 mg/dL (ref 70–99)
Potassium: 4.6 mmol/L (ref 3.5–5.2)
Sodium: 141 mmol/L (ref 134–144)
Total Protein: 7.8 g/dL (ref 6.0–8.5)
eGFR: 85 mL/min/{1.73_m2} (ref >59)

## 2023-05-04 LAB — LIPID PANEL
Chol/HDL Ratio: 6.4 ratio — ABNORMAL HIGH (ref 0.0–5.0)
Cholesterol, Total: 217 mg/dL — ABNORMAL HIGH (ref 100–199)
HDL: 34 mg/dL — ABNORMAL LOW (ref >39)
LDL Chol Calc (NIH): 138 mg/dL — ABNORMAL HIGH (ref 0–99)
Triglycerides: 246 mg/dL — ABNORMAL HIGH (ref 0–149)
VLDL Cholesterol Cal: 45 mg/dL — ABNORMAL HIGH (ref 5–40)

## 2023-05-04 LAB — HEMOGLOBIN A1C
Est. average glucose Bld gHb Est-mCnc: 131 mg/dL
Hgb A1c MFr Bld: 6.2 % — ABNORMAL HIGH (ref 4.8–5.6)

## 2023-05-04 LAB — TSH: TSH: 1.18 u[IU]/mL (ref 0.450–4.500)

## 2023-05-09 DIAGNOSIS — H6121 Impacted cerumen, right ear: Secondary | ICD-10-CM | POA: Insufficient documentation

## 2023-05-09 NOTE — Assessment & Plan Note (Signed)
AFTER OBTAINING VERBAL CONSENT, RIGHT EAR WAS FLUSHED BY IRRIGATION. HE TOLERATED PROCEDURE WELL WITHOUT ANY COMPLICATIONS. NO TM ABNORMALITIES WERE NOTED.

## 2023-05-09 NOTE — Assessment & Plan Note (Signed)
 He is encouraged to initially strive for BMI less than 30 to decrease cardiac risk. He is advised to exercise no less than 150 minutes per week.

## 2023-05-09 NOTE — Assessment & Plan Note (Signed)
A full exam was performed.  DRE deferred. PATIENT IS ADVISED TO GET 30-45 MINUTES REGULAR EXERCISE NO LESS THAN FOUR TO FIVE DAYS PER WEEK - BOTH WEIGHTBEARING EXERCISES AND AEROBIC ARE RECOMMENDED.  PATIENT IS ADVISED TO FOLLOW A HEALTHY DIET WITH AT LEAST SIX FRUITS/VEGGIES PER DAY, DECREASE INTAKE OF RED MEAT, AND TO INCREASE FISH INTAKE TO TWO DAYS PER WEEK.  MEATS/FISH SHOULD NOT BE FRIED, BAKED OR BROILED IS PREFERABLE.  IT IS ALSO IMPORTANT TO CUT BACK ON YOUR SUGAR INTAKE. PLEASE AVOID ANYTHING WITH ADDED SUGAR, CORN SYRUP OR OTHER SWEETENERS. IF YOU MUST USE A SWEETENER, YOU CAN TRY STEVIA. IT IS ALSO IMPORTANT TO AVOID ARTIFICIALLY SWEETENERS AND DIET BEVERAGES. LASTLY, I SUGGEST WEARING SPF 50 SUNSCREEN ON EXPOSED PARTS AND ESPECIALLY WHEN IN THE DIRECT SUNLIGHT FOR AN EXTENDED PERIOD OF TIME.  PLEASE AVOID FAST FOOD RESTAURANTS AND INCREASE YOUR WATER INTAKE.

## 2023-07-19 ENCOUNTER — Ambulatory Visit (INDEPENDENT_AMBULATORY_CARE_PROVIDER_SITE_OTHER): Payer: 59 | Admitting: Family Medicine

## 2023-07-19 ENCOUNTER — Encounter: Payer: Self-pay | Admitting: Family Medicine

## 2023-07-19 VITALS — BP 118/82 | HR 96 | Temp 98.1°F | Ht 70.0 in | Wt 230.0 lb

## 2023-07-19 DIAGNOSIS — R12 Heartburn: Secondary | ICD-10-CM | POA: Diagnosis not present

## 2023-07-19 DIAGNOSIS — Z6833 Body mass index (BMI) 33.0-33.9, adult: Secondary | ICD-10-CM | POA: Diagnosis not present

## 2023-07-19 DIAGNOSIS — E6609 Other obesity due to excess calories: Secondary | ICD-10-CM | POA: Diagnosis not present

## 2023-07-19 DIAGNOSIS — R079 Chest pain, unspecified: Secondary | ICD-10-CM | POA: Diagnosis not present

## 2023-07-19 MED ORDER — OMEPRAZOLE 20 MG PO CPDR: 20.0000 mg | DELAYED_RELEASE_CAPSULE | Freq: Every day | ORAL | 1 refills | Status: DC

## 2023-07-23 DIAGNOSIS — R12 Heartburn: Secondary | ICD-10-CM | POA: Insufficient documentation

## 2023-07-23 DIAGNOSIS — R079 Chest pain, unspecified: Secondary | ICD-10-CM | POA: Insufficient documentation

## 2024-05-07 ENCOUNTER — Encounter: Admitting: Internal Medicine

## 2024-05-07 NOTE — Patient Instructions (Incomplete)
 Health Maintenance, Male  Adopting a healthy lifestyle and getting preventive care are important in promoting health and wellness. Ask your health care provider about:  The right schedule for you to have regular tests and exams.  Things you can do on your own to prevent diseases and keep yourself healthy.  What should I know about diet, weight, and exercise?  Eat a healthy diet    Eat a diet that includes plenty of vegetables, fruits, low-fat dairy products, and lean protein.  Do not eat a lot of foods that are high in solid fats, added sugars, or sodium.  Maintain a healthy weight  Body mass index (BMI) is a measurement that can be used to identify possible weight problems. It estimates body fat based on height and weight. Your health care provider can help determine your BMI and help you achieve or maintain a healthy weight.  Get regular exercise  Get regular exercise. This is one of the most important things you can do for your health. Most adults should:  Exercise for at least 150 minutes each week. The exercise should increase your heart rate and make you sweat (moderate-intensity exercise).  Do strengthening exercises at least twice a week. This is in addition to the moderate-intensity exercise.  Spend less time sitting. Even light physical activity can be beneficial.  Watch cholesterol and blood lipids  Have your blood tested for lipids and cholesterol at 40 years of age, then have this test every 5 years.  You may need to have your cholesterol levels checked more often if:  Your lipid or cholesterol levels are high.  You are older than 40 years of age.  You are at high risk for heart disease.  What should I know about cancer screening?  Many types of cancers can be detected early and may often be prevented. Depending on your health history and family history, you may need to have cancer screening at various ages. This may include screening for:  Colorectal cancer.  Prostate cancer.  Skin cancer.  Lung  cancer.  What should I know about heart disease, diabetes, and high blood pressure?  Blood pressure and heart disease  High blood pressure causes heart disease and increases the risk of stroke. This is more likely to develop in people who have high blood pressure readings or are overweight.  Talk with your health care provider about your target blood pressure readings.  Have your blood pressure checked:  Every 3-5 years if you are 9-95 years of age.  Every year if you are 85 years old or older.  If you are between the ages of 29 and 29 and are a current or former smoker, ask your health care provider if you should have a one-time screening for abdominal aortic aneurysm (AAA).  Diabetes  Have regular diabetes screenings. This checks your fasting blood sugar level. Have the screening done:  Once every three years after age 23 if you are at a normal weight and have a low risk for diabetes.  More often and at a younger age if you are overweight or have a high risk for diabetes.  What should I know about preventing infection?  Hepatitis B  If you have a higher risk for hepatitis B, you should be screened for this virus. Talk with your health care provider to find out if you are at risk for hepatitis B infection.  Hepatitis C  Blood testing is recommended for:  Everyone born from [birth year] through 1967.  Anyone  with known risk factors for hepatitis C.  Sexually transmitted infections (STIs)  You should be screened each year for STIs, including gonorrhea and chlamydia, if:  You are sexually active and are younger than 40 years of age.  You are older than 40 years of age and your health care provider tells you that you are at risk for this type of infection.  Your sexual activity has changed since you were last screened, and you are at increased risk for chlamydia or gonorrhea. Ask your health care provider if you are at risk.  Ask your health care provider about whether you are at high risk for HIV. Your health care provider  may recommend a prescription medicine to help prevent HIV infection. If you choose to take medicine to prevent HIV, you should first get tested for HIV. You should then be tested every 3 months for as long as you are taking the medicine.  Follow these instructions at home:  Alcohol use  Do not drink alcohol if your health care provider tells you not to drink.  If you drink alcohol:  Limit how much you have to 0-2 drinks a day.  Know how much alcohol is in your drink. In the [place] one drink equals one 12 oz bottle of beer (355 mL), one 5 oz glass of wine (148 mL), or one 1 oz glass of hard liquor (44 mL).  Lifestyle  Do not use any products that contain nicotine or tobacco. These products include cigarettes, chewing tobacco, and vaping devices, such as e-cigarettes. If you need help quitting, ask your health care provider.  Do not use street drugs.  Do not share needles.  Ask your health care provider for help if you need support or information about quitting drugs.  General instructions  Schedule regular health, dental, and eye exams.  Stay current with your vaccines.  Tell your health care provider if:  You often feel depressed.  You have ever been abused or do not feel safe at home.  Summary  Adopting a healthy lifestyle and getting preventive care are important in promoting health and wellness.  Follow your health care provider's instructions about healthy diet, exercising, and getting tested or screened for diseases.  Follow your health care provider's instructions on monitoring your cholesterol and blood pressure.  This information is not intended to replace advice given to you by your health care provider. Make sure you discuss any questions you have with your health care provider.  Document Revised: 02/28/2021 Document Reviewed: 02/28/2021  Elsevier Patient Education  2024 [employer].

## 2024-05-07 NOTE — Progress Notes (Deleted)
 I,Cristianna Cyr T Emmitt, CMA,acting as a Neurosurgeon for Catheryn LOISE Slocumb, MD.,have documented all relevant documentation on the behalf of Catheryn LOISE Slocumb, MD,as directed by  Catheryn LOISE Slocumb, MD while in the presence of Catheryn LOISE Slocumb, MD.  Subjective:   Patient ID: Gabriel Watts , male    DOB: 1986/01/02 , 38 y.o.   MRN: [id number]  No chief complaint on file.   HPI  HPI   No past medical history on file.   Family History  Problem Relation Age of Onset   Diabetes Mother    Arthritis Mother    Diabetes Father    Cerebral palsy Brother    Diabetes Maternal Grandmother      Current Outpatient Medications:    HYDROcodone -acetaminophen  (NORCO/VICODIN) 5-325 MG tablet, Take 1 tablet by mouth every 4 (four) hours as needed. (Patient not taking: Reported on 05/03/2023), Disp: 10 tablet, Rfl: 0   ibuprofen  (ADVIL ) 600 MG tablet, Take 1 tablet (600 mg total) by mouth every 6 (six) hours as needed. (Patient not taking: Reported on 05/03/2023), Disp: 30 tablet, Rfl: 0   omeprazole  (PRILOSEC) 20 MG capsule, Take 1 capsule (20 mg total) by mouth daily., Disp: 90 capsule, Rfl: 1   ondansetron  (ZOFRAN ) 4 MG tablet, Take 1 tablet (4 mg total) by mouth every 8 (eight) hours as needed for nausea or vomiting. (Patient not taking: Reported on 05/03/2023), Disp: 10 tablet, Rfl: 0   No Known Allergies   Men's preventive visit. Patient Health Questionnaire (PHQ-2) is  Flowsheet Row Office Visit from 05/03/2023 in [facility]  PHQ-2 Total Score 0  . Patient is on a *** diet. Marital status: Married. Relevant history for alcohol use is:  Social History   Substance and Sexual Activity  Alcohol Use Yes   Comment: socially  . Relevant history for tobacco use is:  Social History   Tobacco Use  Smoking Status Never  Smokeless Tobacco Never  .   Review of Systems  Constitutional: Negative.   HENT: Negative.    Respiratory: Negative.    Gastrointestinal: Negative.   Skin:  Negative.   Allergic/Immunologic: Negative.   Neurological: Negative.   Hematological: Negative.      There were no vitals filed for this visit. There is no height or weight on file to calculate BMI.  Wt Readings from Last 3 Encounters:  07/19/23 230 lb (104.3 kg)  05/03/23 232 lb 9.6 oz (105.5 kg)  06/13/21 225 lb (102.1 kg)    Objective:  Physical Exam      Assessment And Plan:    Routine general medical examination at health care facility     No follow-ups on file. Patient was given opportunity to ask questions. Patient verbalized understanding of the plan and was able to repeat key elements of the plan. All questions were answered to their satisfaction.   Catheryn LOISE Slocumb, MD  I, Catheryn LOISE Slocumb, MD, have reviewed all documentation for this visit. The documentation on 05/07/24 for the exam, diagnosis, procedures, and orders are all accurate and complete.

## 2024-05-08 ENCOUNTER — Encounter: Payer: 59 | Admitting: Internal Medicine

## 2024-05-15 ENCOUNTER — Ambulatory Visit: Admitting: Family Medicine

## 2024-05-15 ENCOUNTER — Encounter: Payer: Self-pay | Admitting: Family Medicine

## 2024-05-15 VITALS — BP 108/70 | HR 81 | Temp 98.1°F | Ht 70.0 in | Wt 224.0 lb

## 2024-05-15 DIAGNOSIS — Z0000 Encounter for general adult medical examination without abnormal findings: Secondary | ICD-10-CM

## 2024-05-15 DIAGNOSIS — R7303 Prediabetes: Secondary | ICD-10-CM | POA: Diagnosis not present

## 2024-05-15 DIAGNOSIS — E7849 Other hyperlipidemia: Secondary | ICD-10-CM

## 2024-05-15 DIAGNOSIS — E6609 Other obesity due to excess calories: Secondary | ICD-10-CM

## 2024-05-15 DIAGNOSIS — E66811 Obesity, class 1: Secondary | ICD-10-CM | POA: Diagnosis not present

## 2024-05-15 DIAGNOSIS — Z6832 Body mass index (BMI) 32.0-32.9, adult: Secondary | ICD-10-CM

## 2024-05-15 NOTE — Progress Notes (Signed)
 I,Jameka Gabriel Watts, CMA,acting as a Neurosurgeon for [employer], NP.,have documented all relevant documentation on the behalf of Gabriel Creighton, NP,as directed by  Gabriel Creighton, NP while in the presence of Gabriel Creighton, NP.  Subjective:   Patient ID: Gabriel Watts , male    DOB: August 14, 1986 , 38 y.o.   MRN: [id number]  Chief Complaint  Patient presents with   Annual Exam    Patient presents today for a physical. Patient doesn't have any questions or concerns at this time.     HPI  Discussed the use of AI scribe software for clinical note transcription with the patient, who gave verbal consent to proceed.  History of Present Illness  Gabriel Watts is a 38 year old male who presents for an annual physical exam.  He has no current complaints or concerns and feels 'pretty good'. He recalls experiencing chest pain almost a year ago, which he attributes to 'really bad gas' that resolved after taking prescribed medication. There has been no recurrence of chest pain since then.  He has a history of elevated cholesterol levels, noted during his last annual exam. In response, he has been exercising more and trying to eat better. He hopes these lifestyle changes have improved his cholesterol levels.  He was informed of being prediabetic with an A1c of 6.2 last year. He acknowledges a family history of diabetes, specifically in his grandmother. He is currently not on any medication.  Regular bowel movements. He does not smoke and drinks alcohol occasionally, about once every three weeks, typically when dining out.   HPI   History reviewed. No pertinent past medical history.   Family History  Problem Relation Age of Onset   Diabetes Mother    Arthritis Mother    Diabetes Father    Cerebral palsy Brother    Diabetes Maternal Grandmother     No current outpatient medications on file.   No Known Allergies   Flowsheet Row Office Visit from 05/15/2024 in [facility]   PHQ-2 Total Score 0   Social History   Substance and Sexual Activity  Alcohol Use Yes   Comment: socially   Social History   Tobacco Use  Smoking Status Never  Smokeless Tobacco Never  .   Review of Systems  Constitutional: Negative.   HENT: Negative.    Eyes: Negative.   Respiratory: Negative.    Cardiovascular: Negative.   Gastrointestinal: Negative.   Endocrine: Negative.   Genitourinary: Negative.   Musculoskeletal: Negative.   Skin: Negative.   Neurological: Negative.   Hematological: Negative.   Psychiatric/Behavioral: Negative.       Today's Vitals   05/15/24 1546  BP: 108/70  Pulse: 81  Temp: 98.1 F (36.7 C)  TempSrc: Oral  Weight: 224 lb (101.6 kg)  Height: 5' 10 (1.778 m)  PainSc: 0-No pain   Body mass index is 32.14 kg/m.  Wt Readings from Last 3 Encounters:  05/15/24 224 lb (101.6 kg)  07/19/23 230 lb (104.3 kg)  05/03/23 232 lb 9.6 oz (105.5 kg)    Objective:  Physical Exam Constitutional:      Appearance: Normal appearance.  HENT:     Head: Normocephalic.  Cardiovascular:     Rate and Rhythm: Normal rate and regular rhythm.     Pulses: Normal pulses.     Heart sounds: Normal heart sounds.  Pulmonary:     Effort: Pulmonary effort is normal.     Breath sounds: Normal breath sounds.  Abdominal:     General: Bowel sounds are normal.  Musculoskeletal:        General: Normal range of motion.  Skin:    General: Skin is warm and dry.  Neurological:     General: No focal deficit present.     Mental Status: He is alert and oriented to person, place, and time. Mental status is at baseline.  Psychiatric:        Mood and Affect: Mood normal.         Assessment And Plan:    Encounter for general adult medical examination w/o abnormal findings -     CBC -     CMP14+EGFR  Prediabetes -     CMP14+EGFR -     Hemoglobin A1c  Hyperlipidemia due to dietary fat intake -     Lipid panel  Class 1 obesity due to excess calories with  body mass index (BMI) of 32.0 to 32.9 in adult, unspecified whether serious comorbidity present Assessment & Plan: He is encouraged to strive for BMI less than 30 to decrease cardiac risk. Advised to aim for at least 150 minutes of exercise per week.     Assessment and Plan Assessment & Plan Prediabetes A1c was 6.2 last year, indicating prediabetes. Family history of diabetes. Exercise and diet modifications recommended to improve insulin sensitivity. - Order A1c test.  Hyperlipidemia Cholesterol levels, including triglycerides and LDL, were high last year. He has been exercising more and eating better to address this issue. - Order lipid panel.  General Health Maintenance At 38 years old, not yet due for colorectal cancer screening. Does not smoke and drinks alcohol occasionally. Regular exercise and dietary improvements are encouraged to manage prediabetes and hyperlipidemia. - Order blood tests to check kidney and liver function. - Schedule next annual physical in one year.    Return for 1 year physical. Patient was given opportunity to ask questions. Patient verbalized understanding of the plan and was able to repeat key elements of the plan. All questions were answered to their satisfaction.   I, Gabriel Creighton, NP, have reviewed all documentation for this visit. The documentation on 05/26/2024 for the exam, diagnosis, procedures, and orders are all accurate and complete.

## 2024-05-15 NOTE — Patient Instructions (Signed)
 Health Maintenance, Male  Adopting a healthy lifestyle and getting preventive care are important in promoting health and wellness. Ask your health care provider about:  The right schedule for you to have regular tests and exams.  Things you can do on your own to prevent diseases and keep yourself healthy.  What should I know about diet, weight, and exercise?  Eat a healthy diet    Eat a diet that includes plenty of vegetables, fruits, low-fat dairy products, and lean protein.  Do not eat a lot of foods that are high in solid fats, added sugars, or sodium.  Maintain a healthy weight  Body mass index (BMI) is a measurement that can be used to identify possible weight problems. It estimates body fat based on height and weight. Your health care provider can help determine your BMI and help you achieve or maintain a healthy weight.  Get regular exercise  Get regular exercise. This is one of the most important things you can do for your health. Most adults should:  Exercise for at least 150 minutes each week. The exercise should increase your heart rate and make you sweat (moderate-intensity exercise).  Do strengthening exercises at least twice a week. This is in addition to the moderate-intensity exercise.  Spend less time sitting. Even light physical activity can be beneficial.  Watch cholesterol and blood lipids  Have your blood tested for lipids and cholesterol at 40 years of age, then have this test every 5 years.  You may need to have your cholesterol levels checked more often if:  Your lipid or cholesterol levels are high.  You are older than 40 years of age.  You are at high risk for heart disease.  What should I know about cancer screening?  Many types of cancers can be detected early and may often be prevented. Depending on your health history and family history, you may need to have cancer screening at various ages. This may include screening for:  Colorectal cancer.  Prostate cancer.  Skin cancer.  Lung  cancer.  What should I know about heart disease, diabetes, and high blood pressure?  Blood pressure and heart disease  High blood pressure causes heart disease and increases the risk of stroke. This is more likely to develop in people who have high blood pressure readings or are overweight.  Talk with your health care provider about your target blood pressure readings.  Have your blood pressure checked:  Every 3-5 years if you are 9-95 years of age.  Every year if you are 85 years old or older.  If you are between the ages of 29 and 29 and are a current or former smoker, ask your health care provider if you should have a one-time screening for abdominal aortic aneurysm (AAA).  Diabetes  Have regular diabetes screenings. This checks your fasting blood sugar level. Have the screening done:  Once every three years after age 23 if you are at a normal weight and have a low risk for diabetes.  More often and at a younger age if you are overweight or have a high risk for diabetes.  What should I know about preventing infection?  Hepatitis B  If you have a higher risk for hepatitis B, you should be screened for this virus. Talk with your health care provider to find out if you are at risk for hepatitis B infection.  Hepatitis C  Blood testing is recommended for:  Everyone born from [birth year] through 1967.  Anyone  with known risk factors for hepatitis C.  Sexually transmitted infections (STIs)  You should be screened each year for STIs, including gonorrhea and chlamydia, if:  You are sexually active and are younger than 40 years of age.  You are older than 40 years of age and your health care provider tells you that you are at risk for this type of infection.  Your sexual activity has changed since you were last screened, and you are at increased risk for chlamydia or gonorrhea. Ask your health care provider if you are at risk.  Ask your health care provider about whether you are at high risk for HIV. Your health care provider  may recommend a prescription medicine to help prevent HIV infection. If you choose to take medicine to prevent HIV, you should first get tested for HIV. You should then be tested every 3 months for as long as you are taking the medicine.  Follow these instructions at home:  Alcohol use  Do not drink alcohol if your health care provider tells you not to drink.  If you drink alcohol:  Limit how much you have to 0-2 drinks a day.  Know how much alcohol is in your drink. In the [place] one drink equals one 12 oz bottle of beer (355 mL), one 5 oz glass of wine (148 mL), or one 1 oz glass of hard liquor (44 mL).  Lifestyle  Do not use any products that contain nicotine or tobacco. These products include cigarettes, chewing tobacco, and vaping devices, such as e-cigarettes. If you need help quitting, ask your health care provider.  Do not use street drugs.  Do not share needles.  Ask your health care provider for help if you need support or information about quitting drugs.  General instructions  Schedule regular health, dental, and eye exams.  Stay current with your vaccines.  Tell your health care provider if:  You often feel depressed.  You have ever been abused or do not feel safe at home.  Summary  Adopting a healthy lifestyle and getting preventive care are important in promoting health and wellness.  Follow your health care provider's instructions about healthy diet, exercising, and getting tested or screened for diseases.  Follow your health care provider's instructions on monitoring your cholesterol and blood pressure.  This information is not intended to replace advice given to you by your health care provider. Make sure you discuss any questions you have with your health care provider.  Document Revised: 02/28/2021 Document Reviewed: 02/28/2021  Elsevier Patient Education  2024 [employer].

## 2024-05-16 LAB — CBC
Hematocrit: 44.3 % (ref 37.5–51.0)
Hemoglobin: 14.7 g/dL (ref 13.0–17.7)
MCH: 27.2 pg (ref 26.6–33.0)
MCHC: 33.2 g/dL (ref 31.5–35.7)
MCV: 82 fL (ref 79–97)
Platelets: 219 x10E3/uL (ref 150–450)
RBC: 5.4 x10E6/uL (ref 4.14–5.80)
RDW: 13.8 % (ref 11.6–15.4)
WBC: 4.2 x10E3/uL (ref 3.4–10.8)

## 2024-05-16 LAB — CMP14+EGFR
ALT: 18 IU/L (ref 0–44)
AST: 23 IU/L (ref 0–40)
Albumin: 4.4 g/dL (ref 4.1–5.1)
Alkaline Phosphatase: 71 IU/L (ref 44–121)
BUN/Creatinine Ratio: 12 (ref 9–20)
BUN: 16 mg/dL (ref 6–20)
Bilirubin Total: 0.7 mg/dL (ref 0.0–1.2)
CO2: 20 mmol/L (ref 20–29)
Calcium: 9.4 mg/dL (ref 8.7–10.2)
Chloride: 102 mmol/L (ref 96–106)
Creatinine, Ser: 1.35 mg/dL — ABNORMAL HIGH (ref 0.76–1.27)
Globulin, Total: 2.9 g/dL (ref 1.5–4.5)
Glucose: 78 mg/dL (ref 70–99)
Potassium: 4.6 mmol/L (ref 3.5–5.2)
Sodium: 139 mmol/L (ref 134–144)
Total Protein: 7.3 g/dL (ref 6.0–8.5)
eGFR: 68 mL/min/1.73 (ref >59)

## 2024-05-16 LAB — LIPID PANEL
Chol/HDL Ratio: 6.8 ratio — ABNORMAL HIGH (ref 0.0–5.0)
Cholesterol, Total: 198 mg/dL (ref 100–199)
HDL: 29 mg/dL — ABNORMAL LOW (ref >39)
LDL Chol Calc (NIH): 149 mg/dL — ABNORMAL HIGH (ref 0–99)
Triglycerides: 108 mg/dL (ref 0–149)
VLDL Cholesterol Cal: 20 mg/dL (ref 5–40)

## 2024-05-16 LAB — HEMOGLOBIN A1C
Est. average glucose Bld gHb Est-mCnc: 128 mg/dL
Hgb A1c MFr Bld: 6.1 % — ABNORMAL HIGH (ref 4.8–5.6)

## 2024-05-26 ENCOUNTER — Ambulatory Visit: Payer: Self-pay | Admitting: Family Medicine

## 2024-05-26 NOTE — Assessment & Plan Note (Signed)
 He is encouraged to strive for BMI less than 30 to decrease cardiac risk. Advised to aim for at least 150 minutes of exercise per week.

## 2024-05-26 NOTE — Progress Notes (Signed)
 Your cholesterol levels have improved from last year but your LDL is still elevated so low-fat diet is still advised eat healthy fats like salmon, nuts such as almond, cashew nuts to increase your HDL.  Your A1c went down to 6.1 but this is still prediabetes so low-carb diet advised so less of things like pasta bread white rice sweets etc. exercise advised also.  Thank you

## 2025-05-21 ENCOUNTER — Encounter: Payer: Self-pay | Admitting: Internal Medicine
